# Patient Record
Sex: Female | Born: 1980 | Race: White | Hispanic: No | Marital: Single | Smoking: Current every day smoker
Health system: Southern US, Community
[De-identification: ages and names within clinical notes are randomized; demographics above are authoritative.]

## PROBLEM LIST (undated history)

## (undated) DIAGNOSIS — F1990 Other psychoactive substance use, unspecified, uncomplicated: Secondary | ICD-10-CM

## (undated) DIAGNOSIS — Z6835 Body mass index (BMI) 35.0-35.9, adult: Secondary | ICD-10-CM

## (undated) DIAGNOSIS — R5383 Other fatigue: Secondary | ICD-10-CM

## (undated) DIAGNOSIS — J45909 Unspecified asthma, uncomplicated: Secondary | ICD-10-CM

## (undated) DIAGNOSIS — T8859XA Other complications of anesthesia, initial encounter: Secondary | ICD-10-CM

## (undated) DIAGNOSIS — Z720 Tobacco use: Secondary | ICD-10-CM

## (undated) DIAGNOSIS — F1110 Opioid abuse, uncomplicated: Secondary | ICD-10-CM

## (undated) DIAGNOSIS — I456 Pre-excitation syndrome: Secondary | ICD-10-CM

## (undated) DIAGNOSIS — T4145XA Adverse effect of unspecified anesthetic, initial encounter: Secondary | ICD-10-CM

## (undated) DIAGNOSIS — I10 Essential (primary) hypertension: Secondary | ICD-10-CM

## (undated) HISTORY — DX: Other fatigue: R53.83

## (undated) HISTORY — DX: Body mass index (BMI) 35.0-35.9, adult: Z68.35

## (undated) HISTORY — DX: Pre-excitation syndrome: I45.6

## (undated) HISTORY — PX: SPINE SURGERY: SHX786

## (undated) HISTORY — PX: OTHER SURGICAL HISTORY: SHX169

## (undated) HISTORY — DX: Unspecified asthma, uncomplicated: J45.909

---

## 2006-04-04 ENCOUNTER — Emergency Department (HOSPITAL_COMMUNITY): Admission: EM | Admit: 2006-04-04 | Discharge: 2006-04-04 | Payer: Self-pay | Admitting: Emergency Medicine

## 2006-09-11 ENCOUNTER — Emergency Department (HOSPITAL_COMMUNITY): Admission: EM | Admit: 2006-09-11 | Discharge: 2006-09-11 | Payer: Self-pay | Admitting: Emergency Medicine

## 2009-05-23 ENCOUNTER — Emergency Department (HOSPITAL_COMMUNITY): Admission: EM | Admit: 2009-05-23 | Discharge: 2009-05-24 | Payer: Self-pay | Admitting: Emergency Medicine

## 2010-11-22 LAB — URINE MICROSCOPIC-ADD ON

## 2010-11-22 LAB — URINALYSIS, ROUTINE W REFLEX MICROSCOPIC
Hgb urine dipstick: NEGATIVE
Specific Gravity, Urine: 1.019
Urobilinogen, UA: 1

## 2011-11-29 DIAGNOSIS — M545 Low back pain, unspecified: Secondary | ICD-10-CM

## 2011-11-29 DIAGNOSIS — G8310 Monoplegia of lower limb affecting unspecified side: Secondary | ICD-10-CM

## 2011-11-29 DIAGNOSIS — R635 Abnormal weight gain: Secondary | ICD-10-CM

## 2011-11-29 HISTORY — DX: Low back pain, unspecified: M54.50

## 2011-11-29 HISTORY — DX: Abnormal weight gain: R63.5

## 2011-11-29 HISTORY — DX: Monoplegia of lower limb affecting unspecified side: G83.10

## 2012-04-09 DIAGNOSIS — D649 Anemia, unspecified: Secondary | ICD-10-CM

## 2012-04-09 DIAGNOSIS — F1920 Other psychoactive substance dependence, uncomplicated: Secondary | ICD-10-CM | POA: Insufficient documentation

## 2012-04-09 HISTORY — DX: Anemia, unspecified: D64.9

## 2012-04-09 HISTORY — DX: Other psychoactive substance dependence, uncomplicated: F19.20

## 2012-10-12 DIAGNOSIS — S82899A Other fracture of unspecified lower leg, initial encounter for closed fracture: Secondary | ICD-10-CM | POA: Insufficient documentation

## 2012-10-12 DIAGNOSIS — R739 Hyperglycemia, unspecified: Secondary | ICD-10-CM

## 2012-10-12 DIAGNOSIS — S82209A Unspecified fracture of shaft of unspecified tibia, initial encounter for closed fracture: Secondary | ICD-10-CM | POA: Insufficient documentation

## 2012-10-12 DIAGNOSIS — R7989 Other specified abnormal findings of blood chemistry: Secondary | ICD-10-CM

## 2012-10-12 DIAGNOSIS — S022XXA Fracture of nasal bones, initial encounter for closed fracture: Secondary | ICD-10-CM

## 2012-10-12 DIAGNOSIS — S82409A Unspecified fracture of shaft of unspecified fibula, initial encounter for closed fracture: Secondary | ICD-10-CM

## 2012-10-12 DIAGNOSIS — F1510 Other stimulant abuse, uncomplicated: Secondary | ICD-10-CM

## 2012-10-12 HISTORY — DX: Fracture of nasal bones, initial encounter for closed fracture: S02.2XXA

## 2012-10-12 HISTORY — DX: Unspecified fracture of shaft of unspecified tibia, initial encounter for closed fracture: S82.209A

## 2012-10-12 HISTORY — DX: Unspecified fracture of shaft of unspecified fibula, initial encounter for closed fracture: S82.409A

## 2012-10-12 HISTORY — DX: Other fracture of unspecified lower leg, initial encounter for closed fracture: S82.899A

## 2012-10-12 HISTORY — DX: Other stimulant abuse, uncomplicated: F15.10

## 2012-10-12 HISTORY — DX: Other specified abnormal findings of blood chemistry: R79.89

## 2012-10-12 HISTORY — DX: Hyperglycemia, unspecified: R73.9

## 2012-11-05 DIAGNOSIS — N179 Acute kidney failure, unspecified: Secondary | ICD-10-CM

## 2012-11-05 HISTORY — DX: Acute kidney failure, unspecified: N17.9

## 2016-07-02 ENCOUNTER — Emergency Department (HOSPITAL_COMMUNITY): Admission: EM | Admit: 2016-07-02 | Discharge: 2016-07-02 | Discharge status: Against Medical Advice | Payer: Self-pay

## 2016-07-02 NOTE — ED Triage Notes (Signed)
Pt came and stated she self catherines herself and she is out of them, tech gave pt in and out cath, pt them left without being triaged

## 2016-12-03 DIAGNOSIS — F1510 Other stimulant abuse, uncomplicated: Secondary | ICD-10-CM | POA: Diagnosis not present

## 2016-12-03 DIAGNOSIS — M62838 Other muscle spasm: Secondary | ICD-10-CM | POA: Diagnosis not present

## 2016-12-03 DIAGNOSIS — F1910 Other psychoactive substance abuse, uncomplicated: Secondary | ICD-10-CM | POA: Diagnosis not present

## 2016-12-03 DIAGNOSIS — G8220 Paraplegia, unspecified: Secondary | ICD-10-CM | POA: Diagnosis not present

## 2016-12-03 DIAGNOSIS — F1410 Cocaine abuse, uncomplicated: Secondary | ICD-10-CM | POA: Diagnosis not present

## 2016-12-03 DIAGNOSIS — Z720 Tobacco use: Secondary | ICD-10-CM | POA: Diagnosis not present

## 2016-12-03 DIAGNOSIS — A480 Gas gangrene: Secondary | ICD-10-CM

## 2016-12-03 DIAGNOSIS — R918 Other nonspecific abnormal finding of lung field: Secondary | ICD-10-CM | POA: Diagnosis not present

## 2016-12-03 DIAGNOSIS — A419 Sepsis, unspecified organism: Secondary | ICD-10-CM | POA: Diagnosis not present

## 2016-12-03 DIAGNOSIS — M86171 Other acute osteomyelitis, right ankle and foot: Secondary | ICD-10-CM | POA: Diagnosis not present

## 2016-12-03 DIAGNOSIS — N319 Neuromuscular dysfunction of bladder, unspecified: Secondary | ICD-10-CM | POA: Diagnosis not present

## 2016-12-03 DIAGNOSIS — I96 Gangrene, not elsewhere classified: Secondary | ICD-10-CM | POA: Diagnosis not present

## 2016-12-03 DIAGNOSIS — N179 Acute kidney failure, unspecified: Secondary | ICD-10-CM | POA: Diagnosis not present

## 2016-12-04 DIAGNOSIS — Z720 Tobacco use: Secondary | ICD-10-CM | POA: Diagnosis not present

## 2016-12-04 DIAGNOSIS — M62838 Other muscle spasm: Secondary | ICD-10-CM | POA: Diagnosis not present

## 2016-12-04 DIAGNOSIS — N319 Neuromuscular dysfunction of bladder, unspecified: Secondary | ICD-10-CM | POA: Diagnosis not present

## 2016-12-04 DIAGNOSIS — N179 Acute kidney failure, unspecified: Secondary | ICD-10-CM | POA: Diagnosis not present

## 2016-12-04 DIAGNOSIS — F1910 Other psychoactive substance abuse, uncomplicated: Secondary | ICD-10-CM | POA: Diagnosis not present

## 2016-12-04 DIAGNOSIS — F1410 Cocaine abuse, uncomplicated: Secondary | ICD-10-CM | POA: Diagnosis not present

## 2016-12-04 DIAGNOSIS — A419 Sepsis, unspecified organism: Secondary | ICD-10-CM | POA: Diagnosis not present

## 2016-12-04 DIAGNOSIS — F1510 Other stimulant abuse, uncomplicated: Secondary | ICD-10-CM | POA: Diagnosis not present

## 2016-12-04 DIAGNOSIS — A480 Gas gangrene: Secondary | ICD-10-CM | POA: Diagnosis not present

## 2016-12-04 DIAGNOSIS — M86171 Other acute osteomyelitis, right ankle and foot: Secondary | ICD-10-CM | POA: Diagnosis not present

## 2016-12-04 DIAGNOSIS — R918 Other nonspecific abnormal finding of lung field: Secondary | ICD-10-CM | POA: Diagnosis not present

## 2016-12-04 DIAGNOSIS — G8220 Paraplegia, unspecified: Secondary | ICD-10-CM | POA: Diagnosis not present

## 2016-12-05 DIAGNOSIS — A480 Gas gangrene: Secondary | ICD-10-CM | POA: Diagnosis not present

## 2016-12-05 DIAGNOSIS — N179 Acute kidney failure, unspecified: Secondary | ICD-10-CM | POA: Diagnosis not present

## 2016-12-05 DIAGNOSIS — M86171 Other acute osteomyelitis, right ankle and foot: Secondary | ICD-10-CM | POA: Diagnosis not present

## 2016-12-05 DIAGNOSIS — A419 Sepsis, unspecified organism: Secondary | ICD-10-CM | POA: Diagnosis not present

## 2016-12-06 DIAGNOSIS — I96 Gangrene, not elsewhere classified: Secondary | ICD-10-CM

## 2016-12-06 DIAGNOSIS — I517 Cardiomegaly: Secondary | ICD-10-CM

## 2016-12-06 DIAGNOSIS — N179 Acute kidney failure, unspecified: Secondary | ICD-10-CM | POA: Diagnosis not present

## 2016-12-06 DIAGNOSIS — A419 Sepsis, unspecified organism: Secondary | ICD-10-CM | POA: Diagnosis not present

## 2016-12-06 DIAGNOSIS — A480 Gas gangrene: Secondary | ICD-10-CM | POA: Diagnosis not present

## 2016-12-06 DIAGNOSIS — M86171 Other acute osteomyelitis, right ankle and foot: Secondary | ICD-10-CM

## 2016-12-07 DIAGNOSIS — M86171 Other acute osteomyelitis, right ankle and foot: Secondary | ICD-10-CM | POA: Diagnosis not present

## 2016-12-07 DIAGNOSIS — A480 Gas gangrene: Secondary | ICD-10-CM | POA: Diagnosis not present

## 2016-12-07 DIAGNOSIS — M62838 Other muscle spasm: Secondary | ICD-10-CM | POA: Diagnosis not present

## 2016-12-07 DIAGNOSIS — F1510 Other stimulant abuse, uncomplicated: Secondary | ICD-10-CM | POA: Diagnosis not present

## 2016-12-07 DIAGNOSIS — N179 Acute kidney failure, unspecified: Secondary | ICD-10-CM | POA: Diagnosis not present

## 2016-12-07 DIAGNOSIS — N319 Neuromuscular dysfunction of bladder, unspecified: Secondary | ICD-10-CM | POA: Diagnosis not present

## 2016-12-07 DIAGNOSIS — F1410 Cocaine abuse, uncomplicated: Secondary | ICD-10-CM | POA: Diagnosis not present

## 2016-12-07 DIAGNOSIS — Z720 Tobacco use: Secondary | ICD-10-CM | POA: Diagnosis not present

## 2016-12-07 DIAGNOSIS — I472 Ventricular tachycardia: Secondary | ICD-10-CM | POA: Diagnosis not present

## 2016-12-07 DIAGNOSIS — R918 Other nonspecific abnormal finding of lung field: Secondary | ICD-10-CM | POA: Diagnosis not present

## 2016-12-07 DIAGNOSIS — G8220 Paraplegia, unspecified: Secondary | ICD-10-CM | POA: Diagnosis not present

## 2016-12-07 DIAGNOSIS — F1910 Other psychoactive substance abuse, uncomplicated: Secondary | ICD-10-CM | POA: Diagnosis not present

## 2016-12-07 DIAGNOSIS — A419 Sepsis, unspecified organism: Secondary | ICD-10-CM | POA: Diagnosis not present

## 2016-12-08 DIAGNOSIS — M86171 Other acute osteomyelitis, right ankle and foot: Secondary | ICD-10-CM | POA: Diagnosis not present

## 2016-12-08 DIAGNOSIS — A419 Sepsis, unspecified organism: Secondary | ICD-10-CM | POA: Diagnosis not present

## 2016-12-08 DIAGNOSIS — N179 Acute kidney failure, unspecified: Secondary | ICD-10-CM | POA: Diagnosis not present

## 2016-12-08 DIAGNOSIS — I472 Ventricular tachycardia: Secondary | ICD-10-CM | POA: Diagnosis not present

## 2016-12-08 DIAGNOSIS — A480 Gas gangrene: Secondary | ICD-10-CM | POA: Diagnosis not present

## 2016-12-09 DIAGNOSIS — A480 Gas gangrene: Secondary | ICD-10-CM | POA: Diagnosis not present

## 2016-12-09 DIAGNOSIS — A419 Sepsis, unspecified organism: Secondary | ICD-10-CM | POA: Diagnosis not present

## 2016-12-09 DIAGNOSIS — M86171 Other acute osteomyelitis, right ankle and foot: Secondary | ICD-10-CM | POA: Diagnosis not present

## 2016-12-09 DIAGNOSIS — N179 Acute kidney failure, unspecified: Secondary | ICD-10-CM | POA: Diagnosis not present

## 2016-12-10 DIAGNOSIS — M86171 Other acute osteomyelitis, right ankle and foot: Secondary | ICD-10-CM | POA: Diagnosis not present

## 2016-12-10 DIAGNOSIS — A419 Sepsis, unspecified organism: Secondary | ICD-10-CM | POA: Diagnosis not present

## 2016-12-10 DIAGNOSIS — A480 Gas gangrene: Secondary | ICD-10-CM | POA: Diagnosis not present

## 2016-12-10 DIAGNOSIS — N179 Acute kidney failure, unspecified: Secondary | ICD-10-CM | POA: Diagnosis not present

## 2016-12-11 DIAGNOSIS — M86171 Other acute osteomyelitis, right ankle and foot: Secondary | ICD-10-CM | POA: Diagnosis not present

## 2016-12-11 DIAGNOSIS — N179 Acute kidney failure, unspecified: Secondary | ICD-10-CM | POA: Diagnosis not present

## 2016-12-11 DIAGNOSIS — A419 Sepsis, unspecified organism: Secondary | ICD-10-CM | POA: Diagnosis not present

## 2016-12-11 DIAGNOSIS — A480 Gas gangrene: Secondary | ICD-10-CM | POA: Diagnosis not present

## 2016-12-12 DIAGNOSIS — M86171 Other acute osteomyelitis, right ankle and foot: Secondary | ICD-10-CM | POA: Diagnosis not present

## 2016-12-12 DIAGNOSIS — A480 Gas gangrene: Secondary | ICD-10-CM | POA: Diagnosis not present

## 2016-12-12 DIAGNOSIS — A419 Sepsis, unspecified organism: Secondary | ICD-10-CM | POA: Diagnosis not present

## 2016-12-12 DIAGNOSIS — N179 Acute kidney failure, unspecified: Secondary | ICD-10-CM | POA: Diagnosis not present

## 2016-12-13 ENCOUNTER — Ambulatory Visit: Payer: Medicaid Other | Admitting: Podiatry

## 2017-02-07 HISTORY — PX: LEG AMPUTATION: SHX1105

## 2017-02-17 DIAGNOSIS — L03115 Cellulitis of right lower limb: Secondary | ICD-10-CM

## 2017-02-17 DIAGNOSIS — N390 Urinary tract infection, site not specified: Secondary | ICD-10-CM | POA: Diagnosis not present

## 2017-02-17 DIAGNOSIS — G9341 Metabolic encephalopathy: Secondary | ICD-10-CM

## 2017-02-17 DIAGNOSIS — R42 Dizziness and giddiness: Secondary | ICD-10-CM

## 2017-02-17 DIAGNOSIS — R509 Fever, unspecified: Secondary | ICD-10-CM | POA: Diagnosis not present

## 2017-02-17 DIAGNOSIS — M86171 Other acute osteomyelitis, right ankle and foot: Secondary | ICD-10-CM

## 2017-02-18 DIAGNOSIS — L03115 Cellulitis of right lower limb: Secondary | ICD-10-CM | POA: Diagnosis not present

## 2017-02-18 DIAGNOSIS — G9341 Metabolic encephalopathy: Secondary | ICD-10-CM | POA: Diagnosis not present

## 2017-02-18 DIAGNOSIS — R42 Dizziness and giddiness: Secondary | ICD-10-CM | POA: Diagnosis not present

## 2017-02-18 DIAGNOSIS — M86171 Other acute osteomyelitis, right ankle and foot: Secondary | ICD-10-CM | POA: Diagnosis not present

## 2017-02-18 DIAGNOSIS — R509 Fever, unspecified: Secondary | ICD-10-CM | POA: Diagnosis not present

## 2017-06-01 ENCOUNTER — Other Ambulatory Visit: Payer: Self-pay

## 2017-06-01 ENCOUNTER — Encounter (HOSPITAL_COMMUNITY): Payer: Self-pay | Admitting: Emergency Medicine

## 2017-06-01 ENCOUNTER — Emergency Department (HOSPITAL_COMMUNITY): Payer: Medicaid Other

## 2017-06-01 ENCOUNTER — Inpatient Hospital Stay (HOSPITAL_COMMUNITY)
Admission: EM | Admit: 2017-06-01 | Discharge: 2017-06-02 | DRG: 541 | Disposition: A | Discharge status: Home / Self Care | Payer: Medicaid Other | Attending: Internal Medicine | Admitting: Internal Medicine

## 2017-06-01 DIAGNOSIS — M86471 Chronic osteomyelitis with draining sinus, right ankle and foot: Secondary | ICD-10-CM | POA: Diagnosis present

## 2017-06-01 DIAGNOSIS — R002 Palpitations: Secondary | ICD-10-CM | POA: Diagnosis present

## 2017-06-01 DIAGNOSIS — M869 Osteomyelitis, unspecified: Secondary | ICD-10-CM

## 2017-06-01 DIAGNOSIS — Z833 Family history of diabetes mellitus: Secondary | ICD-10-CM

## 2017-06-01 DIAGNOSIS — G8389 Other specified paralytic syndromes: Secondary | ICD-10-CM | POA: Diagnosis present

## 2017-06-01 DIAGNOSIS — Z8249 Family history of ischemic heart disease and other diseases of the circulatory system: Secondary | ICD-10-CM

## 2017-06-01 DIAGNOSIS — Z720 Tobacco use: Secondary | ICD-10-CM

## 2017-06-01 DIAGNOSIS — W3400XS Accidental discharge from unspecified firearms or gun, sequela: Secondary | ICD-10-CM | POA: Diagnosis not present

## 2017-06-01 DIAGNOSIS — F17210 Nicotine dependence, cigarettes, uncomplicated: Secondary | ICD-10-CM | POA: Diagnosis present

## 2017-06-01 DIAGNOSIS — F1911 Other psychoactive substance abuse, in remission: Secondary | ICD-10-CM | POA: Diagnosis present

## 2017-06-01 DIAGNOSIS — F1990 Other psychoactive substance use, unspecified, uncomplicated: Secondary | ICD-10-CM | POA: Diagnosis not present

## 2017-06-01 DIAGNOSIS — B955 Unspecified streptococcus as the cause of diseases classified elsewhere: Secondary | ICD-10-CM | POA: Diagnosis present

## 2017-06-01 DIAGNOSIS — I10 Essential (primary) hypertension: Secondary | ICD-10-CM

## 2017-06-01 DIAGNOSIS — R000 Tachycardia, unspecified: Secondary | ICD-10-CM | POA: Diagnosis present

## 2017-06-01 DIAGNOSIS — R079 Chest pain, unspecified: Secondary | ICD-10-CM | POA: Diagnosis present

## 2017-06-01 DIAGNOSIS — R590 Localized enlarged lymph nodes: Secondary | ICD-10-CM | POA: Diagnosis present

## 2017-06-01 DIAGNOSIS — S31149S Puncture wound of abdominal wall with foreign body, unspecified quadrant without penetration into peritoneal cavity, sequela: Secondary | ICD-10-CM

## 2017-06-01 DIAGNOSIS — M79671 Pain in right foot: Secondary | ICD-10-CM | POA: Diagnosis not present

## 2017-06-01 DIAGNOSIS — Z885 Allergy status to narcotic agent status: Secondary | ICD-10-CM | POA: Diagnosis not present

## 2017-06-01 DIAGNOSIS — F1110 Opioid abuse, uncomplicated: Secondary | ICD-10-CM

## 2017-06-01 DIAGNOSIS — M86671 Other chronic osteomyelitis, right ankle and foot: Secondary | ICD-10-CM | POA: Diagnosis not present

## 2017-06-01 DIAGNOSIS — B9561 Methicillin susceptible Staphylococcus aureus infection as the cause of diseases classified elsewhere: Secondary | ICD-10-CM | POA: Diagnosis present

## 2017-06-01 HISTORY — DX: Adverse effect of unspecified anesthetic, initial encounter: T41.45XA

## 2017-06-01 HISTORY — DX: Other complications of anesthesia, initial encounter: T88.59XA

## 2017-06-01 HISTORY — DX: Other psychoactive substance use, unspecified, uncomplicated: F19.90

## 2017-06-01 HISTORY — DX: Tobacco use: Z72.0

## 2017-06-01 HISTORY — DX: Opioid abuse, uncomplicated: F11.10

## 2017-06-01 HISTORY — DX: Osteomyelitis, unspecified: M86.9

## 2017-06-01 HISTORY — DX: Essential (primary) hypertension: I10

## 2017-06-01 LAB — CBC WITH DIFFERENTIAL/PLATELET
BASOS PCT: 0 %
Basophils Absolute: 0 10*3/uL (ref 0.0–0.1)
EOS ABS: 0.2 10*3/uL (ref 0.0–0.7)
Eosinophils Relative: 2 %
HEMATOCRIT: 45.5 % (ref 36.0–46.0)
Hemoglobin: 15.4 g/dL — ABNORMAL HIGH (ref 12.0–15.0)
Lymphocytes Relative: 22 %
Lymphs Abs: 2.1 10*3/uL (ref 0.7–4.0)
MCH: 27.9 pg (ref 26.0–34.0)
MCHC: 33.8 g/dL (ref 30.0–36.0)
MCV: 82.6 fL (ref 78.0–100.0)
MONOS PCT: 7 %
Monocytes Absolute: 0.7 10*3/uL (ref 0.1–1.0)
Neutro Abs: 6.5 10*3/uL (ref 1.7–7.7)
Neutrophils Relative %: 69 %
Platelets: 362 10*3/uL (ref 150–400)
RBC: 5.51 MIL/uL — ABNORMAL HIGH (ref 3.87–5.11)
RDW: 14.9 % (ref 11.5–15.5)
WBC: 9.4 10*3/uL (ref 4.0–10.5)

## 2017-06-01 LAB — COMPREHENSIVE METABOLIC PANEL
ALBUMIN: 3.9 g/dL (ref 3.5–5.0)
ALK PHOS: 162 U/L — AB (ref 38–126)
ALT: 27 U/L (ref 14–54)
AST: 25 U/L (ref 15–41)
Anion gap: 10 (ref 5–15)
BUN: 30 mg/dL — ABNORMAL HIGH (ref 6–20)
CALCIUM: 9.8 mg/dL (ref 8.9–10.3)
CO2: 27 mmol/L (ref 22–32)
CREATININE: 0.84 mg/dL (ref 0.44–1.00)
Chloride: 102 mmol/L (ref 101–111)
GFR calc Af Amer: >60 mL/min (ref >60)
GFR calc non Af Amer: >60 mL/min (ref >60)
GLUCOSE: 104 mg/dL — AB (ref 65–99)
Potassium: 5 mmol/L (ref 3.5–5.1)
SODIUM: 139 mmol/L (ref 135–145)
Total Bilirubin: 0.3 mg/dL (ref 0.3–1.2)
Total Protein: 9.4 g/dL — ABNORMAL HIGH (ref 6.5–8.1)

## 2017-06-01 LAB — RAPID URINE DRUG SCREEN, HOSP PERFORMED
AMPHETAMINES: NOT DETECTED
BARBITURATES: NOT DETECTED
Benzodiazepines: NOT DETECTED
Cocaine: NOT DETECTED
Opiates: NOT DETECTED
Tetrahydrocannabinol: NOT DETECTED

## 2017-06-01 LAB — URINALYSIS, ROUTINE W REFLEX MICROSCOPIC
BILIRUBIN URINE: NEGATIVE
Glucose, UA: NEGATIVE mg/dL
Hgb urine dipstick: NEGATIVE
KETONES UR: NEGATIVE mg/dL
Leukocytes, UA: NEGATIVE
NITRITE: NEGATIVE
PH: 6 (ref 5.0–8.0)
Protein, ur: NEGATIVE mg/dL
Specific Gravity, Urine: 1.019 (ref 1.005–1.030)

## 2017-06-01 LAB — PROTIME-INR
INR: 0.98
PROTHROMBIN TIME: 12.9 s (ref 11.4–15.2)

## 2017-06-01 LAB — SEDIMENTATION RATE: Sed Rate: 33 mm/hr — ABNORMAL HIGH (ref 0–22)

## 2017-06-01 LAB — TYPE AND SCREEN
ABO/RH(D): A POS
ANTIBODY SCREEN: NEGATIVE

## 2017-06-01 LAB — HCG, QUANTITATIVE, PREGNANCY: hCG, Beta Chain, Quant, S: <1 m[IU]/mL (ref <5)

## 2017-06-01 LAB — APTT: aPTT: 35 seconds (ref 24–36)

## 2017-06-01 LAB — C-REACTIVE PROTEIN

## 2017-06-01 LAB — I-STAT CG4 LACTIC ACID, ED: Lactic Acid, Venous: 1.04 mmol/L (ref 0.5–1.9)

## 2017-06-01 MED ORDER — ZOLPIDEM TARTRATE 5 MG PO TABS: 5.0000 mg | ORAL_TABLET | Freq: Every evening | ORAL | Status: DC | PRN

## 2017-06-01 MED ORDER — ACETAMINOPHEN 650 MG RE SUPP: 650.0000 mg | Freq: Four times a day (QID) | RECTAL | Status: DC | PRN

## 2017-06-01 MED ORDER — MUPIROCIN 2 % EX OINT
1.0000 "application " | TOPICAL_OINTMENT | Freq: Two times a day (BID) | CUTANEOUS | Status: DC
  Administered 2017-06-02: 1 via NASAL
  Filled 2017-06-01: qty 22

## 2017-06-01 MED ORDER — SODIUM CHLORIDE 0.9 % IV SOLN
INTRAVENOUS | Status: DC
  Administered 2017-06-01 – 2017-06-02 (×2): via INTRAVENOUS

## 2017-06-01 MED ORDER — NICOTINE 21 MG/24HR TD PT24
21.0000 mg | MEDICATED_PATCH | TRANSDERMAL | Status: DC
  Administered 2017-06-01: 21 mg via TRANSDERMAL
  Filled 2017-06-01: qty 1

## 2017-06-01 MED ORDER — KETOROLAC TROMETHAMINE 30 MG/ML IJ SOLN
15.0000 mg | Freq: Four times a day (QID) | INTRAMUSCULAR | Status: DC | PRN
  Administered 2017-06-02 (×2): 15 mg via INTRAVENOUS
  Filled 2017-06-01 (×2): qty 1

## 2017-06-01 MED ORDER — KETOROLAC TROMETHAMINE 30 MG/ML IJ SOLN
15.0000 mg | Freq: Once | INTRAMUSCULAR | Status: AC
  Administered 2017-06-01: 15 mg via INTRAVENOUS
  Filled 2017-06-01: qty 1

## 2017-06-01 MED ORDER — ONDANSETRON HCL 4 MG PO TABS: 4.0000 mg | ORAL_TABLET | Freq: Four times a day (QID) | ORAL | Status: DC | PRN

## 2017-06-01 MED ORDER — SENNOSIDES-DOCUSATE SODIUM 8.6-50 MG PO TABS: 1.0000 | ORAL_TABLET | Freq: Every evening | ORAL | Status: DC | PRN

## 2017-06-01 MED ORDER — HYDRALAZINE HCL 20 MG/ML IJ SOLN: 5.0000 mg | INTRAMUSCULAR | Status: DC | PRN

## 2017-06-01 MED ORDER — ACETAMINOPHEN 325 MG PO TABS
650.0000 mg | ORAL_TABLET | Freq: Four times a day (QID) | ORAL | Status: DC | PRN
  Administered 2017-06-01 – 2017-06-02 (×3): 650 mg via ORAL
  Filled 2017-06-01 (×4): qty 2

## 2017-06-01 MED ORDER — ONDANSETRON HCL 4 MG/2ML IJ SOLN: 4.0000 mg | Freq: Four times a day (QID) | INTRAMUSCULAR | Status: DC | PRN

## 2017-06-01 MED ORDER — SODIUM CHLORIDE 0.9 % IV BOLUS
1000.0000 mL | Freq: Once | INTRAVENOUS | Status: AC
  Administered 2017-06-01: 1000 mL via INTRAVENOUS

## 2017-06-01 NOTE — ED Notes (Signed)
Vein dried up before I could get a dark green for the I Stat lactic

## 2017-06-01 NOTE — ED Notes (Signed)
Pt made aware that she may eat until midnight.

## 2017-06-01 NOTE — ED Notes (Signed)
Pt self caths  

## 2017-06-01 NOTE — ED Notes (Signed)
Unable to get blood from hall c. Tech and/or phlebotomy to try.
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
## 2017-06-01 NOTE — ED Triage Notes (Signed)
Per patient, states she has took holes in her right ankle which is draining puss-states she was suppose to get foot amputated but went to jail instead-states it has been going on for months-complaining of chest pain at night and heart racing

## 2017-06-01 NOTE — ED Notes (Signed)
Called main lab to get the dark green tube

## 2017-06-01 NOTE — ED Notes (Signed)
[facility] Expected date:  Expected time:  Means of arrival:  Comments: Hold for triage-[place]

## 2017-06-01 NOTE — ED Notes (Signed)
ED TO INPATIENT HANDOFF REPORT  Name/Age/Gender Jennifer Thomas 37 y.o. female  Code Status    Code Status Orders  (From admission, onward)        Start     Ordered   06/01/17 2032  Full code  Continuous     06/01/17 2032    Code Status History    This patient has a current code status but no historical code status.      Home/SNF/Other jail  Chief Complaint ankle pain (R)  Level of Care/Admitting Diagnosis ED Disposition    ED Disposition Condition [place] [facility] Area: [place] [100102]  Level of Care: Med-Surg [16]  Diagnosis: Osteomyelitis of right foot [facility]) [[id number]]  Admitting Physician: Ivor Costa [[id number]]  Attending Physician: Ivor Costa [phone]  Estimated length of stay: past midnight tomorrow  Certification:: I certify this patient will need inpatient services for at least 2 midnights  PT Class (Do Not Modify): Inpatient [101]  PT Acc Code (Do Not Modify): Private [1]       Medical History Past Medical History:  Diagnosis Date  . Complication of anesthesia    tolerates aneth. ok but very afraid of face masks  . Heroin abuse ([place])   . Hypertension   . IVDU (intravenous drug user)   . Tobacco abuse     Allergies Allergies  Allergen Reactions  . Morphine And Related Anaphylaxis    IV Location/Drains/Wounds Patient Lines/Drains/Airways Status   Active Line/Drains/Airways    Name:   Placement date:   Placement time:   Site:   Days:   Peripheral IV 06/01/17 Right Hand   06/01/17    1604    Hand   less than 1          Labs/Imaging Results for orders placed or performed during the hospital encounter of 06/01/17 (from the past 48 hour(s))  Comprehensive metabolic panel     Status: Abnormal   Collection Time: 06/01/17  3:47 PM  Result Value Ref Range   Sodium 139 135 - 145 mmol/L   Potassium 5.0 3.5 - 5.1 mmol/L   Chloride 102 101 - 111 mmol/L   CO2 27 22 - 32 mmol/L   Glucose, Bld 104 (H) 65 - 99 mg/dL   BUN 30 (H) 6 - 20 mg/dL   Creatinine, Ser 0.84 0.44 - 1.00 mg/dL   Calcium 9.8 8.9 - 10.3 mg/dL   Total Protein 9.4 (H) 6.5 - 8.1 g/dL   Albumin 3.9 3.5 - 5.0 g/dL   AST 25 15 - 41 U/L   ALT 27 14 - 54 U/L   Alkaline Phosphatase 162 (H) 38 - 126 U/L   Total Bilirubin 0.3 0.3 - 1.2 mg/dL   GFR calc non Af Amer >60 >60 mL/min   GFR calc Af Amer >60 >60 mL/min    Comment: (NOTE) The eGFR has been calculated using the CKD EPI equation. This calculation has not been validated in all clinical situations. eGFR's persistently <60 mL/min signify possible Chronic Kidney Disease.    Anion gap 10 5 - 15    Comment: Performed at [facility], [place] [address], [place],  [address]  CBC with Differential     Status: Abnormal   Collection Time: 06/01/17  3:47 PM  Result Value Ref Range   WBC 9.4 4.0 - 10.5 K/uL   RBC 5.51 (H) 3.87 - 5.11 MIL/uL   Hemoglobin 15.4 (H) 12.0 - 15.0 g/dL   HCT  45.5 36.0 - 46.0 %   MCV 82.6 78.0 - 100.0 fL   MCH 27.9 26.0 - 34.0 pg   MCHC 33.8 30.0 - 36.0 g/dL   RDW 14.9 11.5 - 15.5 %   Platelets 362 150 - 400 K/uL   Neutrophils Relative % 69 %   Neutro Abs 6.5 1.7 - 7.7 K/uL   Lymphocytes Relative 22 %   Lymphs Abs 2.1 0.7 - 4.0 K/uL   Monocytes Relative 7 %   Monocytes Absolute 0.7 0.1 - 1.0 K/uL   Eosinophils Relative 2 %   Eosinophils Absolute 0.2 0.0 - 0.7 K/uL   Basophils Relative 0 %   Basophils Absolute 0.0 0.0 - 0.1 K/uL    Comment: Performed at [facility], [place] [address], [place], [place] [address]  Urinalysis, Routine w reflex microscopic     Status: None   Collection Time: 06/01/17  3:47 PM  Result Value Ref Range   Color, Urine YELLOW YELLOW   APPearance CLEAR CLEAR   Specific Gravity, Urine 1.019 1.005 - 1.030   pH 6.0 5.0 - 8.0   Glucose, UA NEGATIVE NEGATIVE mg/dL   Hgb urine dipstick NEGATIVE NEGATIVE   Bilirubin Urine NEGATIVE NEGATIVE   Ketones, ur NEGATIVE NEGATIVE mg/dL   Protein, ur  NEGATIVE NEGATIVE mg/dL   Nitrite NEGATIVE NEGATIVE   Leukocytes, UA NEGATIVE NEGATIVE    Comment: Performed at [place] [address], [place], [place] [address]  Sedimentation rate     Status: Abnormal   Collection Time: 06/01/17  3:47 PM  Result Value Ref Range   Sed Rate 33 (H) 0 - 22 mm/hr    Comment: Performed at [facility], [place] [address] [place], [place] [address]  C-reactive protein     Status: None   Collection Time: 06/01/17  3:47 PM  Result Value Ref Range   CRP <0.8 <1.0 mg/dL    Comment: Performed at [facility], [place] [address] [place], [place] [address]  I-Stat CG4 Lactic Acid, ED     Status: None   Collection Time: 06/01/17  5:00 PM  Result Value Ref Range   Lactic Acid, Venous 1.04 0.5 - 1.9 mmol/L  Protime-INR     Status: None   Collection Time: 06/01/17  8:56 PM  Result Value Ref Range   Prothrombin Time 12.9 11.4 - 15.2 seconds   INR 0.98     Comment: Performed at [facility], [place] [address], [place], [place] [address]  APTT     Status: None   Collection Time: 06/01/17  8:56 PM  Result Value Ref Range   aPTT 35 24 - 36 seconds    Comment: Performed at [facility], [place] [address], [place], [place] [address]   Dg Foot Complete Right  Result Date: 06/01/2017 CLINICAL DATA:  Per order- possible infection Per patient, states she has took holes in her right ankle which is draining puss-states she was supposed to get foot amputated but went to jail instead-states it has been going on for months EXAM: RIGHT FOOT COMPLETE - 3+ VIEW COMPARISON:  CT of the LOWER extremity on 02/17/2016 FINDINGS: There is deformity of the distal tibia and fibula, consistent with prior fracture. There is diffuse soft tissue swelling of the foot and ankle. Sclerotic and lytic lesion in the calcaneus is consistent with osteomyelitis and has progressed since the previous exam. There is a soft tissue  defect and a small  area of soft tissue gas along the plantar surface of the heel. IMPRESSION: Progressive changes of osteomyelitis. Suspect draining fistula to the plantar surface of the heel, consistent with Brodie abscess. Electronically Signed   By: Nolon Nations M.D.   On: 06/01/2017 18:19    Pending Labs Unresulted Labs (From admission, onward)   Start     Ordered   06/01/17 2030  hCG, quantitative, pregnancy  Once,   R     06/01/17 2030   06/01/17 2029  Type and screen [place]  Once,   R    Comments:  [place]    06/01/17 2029   06/01/17 2029  HIV antibody  Once,   R     06/01/17 2029   06/01/17 2028  Aerobic/Anaerobic Culture (surgical/deep wound)  Once,   R     06/01/17 2027   06/01/17 2028  Rapid urine drug screen (hospital performed)  Once,   R     06/01/17 2029   06/01/17 2027  Culture, blood (Routine X 2) w Reflex to ID Panel  BLOOD CULTURE X 2,   R    Comments:  Please obtain prior to antibiotic administration.    06/01/17 2027      Vitals/Pain Today's Vitals   06/01/17 1505 06/01/17 1512 06/01/17 1736 06/01/17 2004  BP:  (!) 132/105 119/84 124/84  Pulse:  (!) 108 75 76  Resp:  '16 18 18  '$ Temp:  98 F (36.7 C) 98.4 F (36.9 C)   TempSrc:  Oral Oral   SpO2:  100% 98% 99%  PainSc: 10-Worst pain ever       Isolation Precautions No active isolations  Medications Medications  ketorolac (TORADOL) 30 MG/ML injection 15 mg (has no administration in time range)  nicotine (NICODERM CQ - dosed in mg/24 hours) patch 21 mg (has no administration in time range)  acetaminophen (TYLENOL) tablet 650 mg (has no administration in time range)    Or  acetaminophen (TYLENOL) suppository 650 mg (has no administration in time range)  senna-docusate (Senokot-S) tablet 1 tablet (has no administration in time range)  ondansetron (ZOFRAN) tablet 4 mg (has no administration in time range)    Or  ondansetron (ZOFRAN) injection 4 mg  (has no administration in time range)  hydrALAZINE (APRESOLINE) injection 5 mg (has no administration in time range)  zolpidem (AMBIEN) tablet 5 mg (has no administration in time range)  0.9 %  sodium chloride infusion (has no administration in time range)  sodium chloride 0.9 % bolus 1,000 mL (has no administration in time range)  ketorolac (TORADOL) 30 MG/ML injection 15 mg (15 mg Intravenous Given 06/01/17 1710)    Mobility non-ambulatory

## 2017-06-01 NOTE — Plan of Care (Signed)
Reviewed plan of care, safety precautions, and pain control measures. Pt attentive and verbalized understanding.

## 2017-06-02 ENCOUNTER — Encounter (HOSPITAL_COMMUNITY): Payer: Self-pay | Admitting: Internal Medicine

## 2017-06-02 ENCOUNTER — Inpatient Hospital Stay (HOSPITAL_COMMUNITY): Payer: Medicaid Other

## 2017-06-02 DIAGNOSIS — M86471 Chronic osteomyelitis with draining sinus, right ankle and foot: Principal | ICD-10-CM

## 2017-06-02 DIAGNOSIS — M86671 Other chronic osteomyelitis, right ankle and foot: Secondary | ICD-10-CM

## 2017-06-02 DIAGNOSIS — I10 Essential (primary) hypertension: Secondary | ICD-10-CM

## 2017-06-02 LAB — LIPID PANEL
Cholesterol: 106 mg/dL (ref 0–200)
HDL: 26 mg/dL — ABNORMAL LOW (ref >40)
LDL CALC: 61 mg/dL (ref 0–99)
Total CHOL/HDL Ratio: 4.1 RATIO
Triglycerides: 97 mg/dL (ref <150)
VLDL: 19 mg/dL (ref 0–40)

## 2017-06-02 LAB — HIV ANTIBODY (ROUTINE TESTING W REFLEX): HIV Screen 4th Generation wRfx: NONREACTIVE

## 2017-06-02 LAB — SURGICAL PCR SCREEN
MRSA, PCR: POSITIVE — AB
Staphylococcus aureus: POSITIVE — AB

## 2017-06-02 LAB — ABO/RH: ABO/RH(D): A POS

## 2017-06-02 LAB — ECHOCARDIOGRAM COMPLETE
Height: 63 in
WEIGHTICAEL: 2480 [oz_av]

## 2017-06-02 LAB — TROPONIN I

## 2017-06-02 LAB — GLUCOSE, CAPILLARY: GLUCOSE-CAPILLARY: 86 mg/dL (ref 65–99)

## 2017-06-02 LAB — HEMOGLOBIN A1C
HEMOGLOBIN A1C: 5.3 % (ref 4.8–5.6)
Mean Plasma Glucose: 105.41 mg/dL

## 2017-06-02 LAB — TSH: TSH: 0.633 u[IU]/mL (ref 0.350–4.500)

## 2017-06-02 MED ORDER — IBUPROFEN 400 MG PO TABS
400.0000 mg | ORAL_TABLET | Freq: Four times a day (QID) | ORAL | Status: DC | PRN
  Administered 2017-06-02 (×2): 400 mg via ORAL
  Filled 2017-06-02 (×2): qty 1

## 2017-06-02 MED ORDER — METHOCARBAMOL 500 MG PO TABS
500.0000 mg | ORAL_TABLET | Freq: Three times a day (TID) | ORAL | Status: DC | PRN
  Administered 2017-06-02: 500 mg via ORAL
  Filled 2017-06-02: qty 1

## 2017-06-02 MED ORDER — SACCHAROMYCES BOULARDII 250 MG PO CAPS: 250.0000 mg | ORAL_CAPSULE | Freq: Two times a day (BID) | ORAL | 0 refills | Status: DC

## 2017-06-02 MED ORDER — JUVEN PO PACK: 1.0000 | PACK | Freq: Two times a day (BID) | ORAL | 0 refills | Status: DC

## 2017-06-02 MED ORDER — GABAPENTIN 300 MG PO CAPS: 300.0000 mg | ORAL_CAPSULE | Freq: Three times a day (TID) | ORAL | 0 refills | Status: DC

## 2017-06-02 MED ORDER — FAMOTIDINE 20 MG PO TABS: 20.0000 mg | ORAL_TABLET | Freq: Every day | ORAL | 0 refills | Status: DC

## 2017-06-02 MED ORDER — CHLORHEXIDINE GLUCONATE CLOTH 2 % EX PADS
6.0000 | MEDICATED_PAD | Freq: Every day | CUTANEOUS | Status: DC
  Administered 2017-06-02: 6 via TOPICAL

## 2017-06-02 MED ORDER — NAPROXEN 500 MG PO TBEC: 500.0000 mg | DELAYED_RELEASE_TABLET | Freq: Two times a day (BID) | ORAL | 0 refills | Status: AC

## 2017-06-02 MED ORDER — GABAPENTIN 300 MG PO CAPS
300.0000 mg | ORAL_CAPSULE | Freq: Three times a day (TID) | ORAL | Status: DC
  Administered 2017-06-02 (×2): 300 mg via ORAL
  Filled 2017-06-02 (×2): qty 1

## 2017-06-02 MED ORDER — SENNOSIDES-DOCUSATE SODIUM 8.6-50 MG PO TABS: 1.0000 | ORAL_TABLET | Freq: Every evening | ORAL | 0 refills | Status: DC | PRN

## 2017-06-02 MED ORDER — JUVEN PO PACK
1.0000 | PACK | Freq: Two times a day (BID) | ORAL | Status: DC
  Administered 2017-06-02: 1 via ORAL
  Filled 2017-06-02: qty 1

## 2017-06-02 MED ORDER — NICOTINE 21 MG/24HR TD PT24: 21.0000 mg | MEDICATED_PATCH | TRANSDERMAL | 0 refills | Status: DC

## 2017-06-02 MED ORDER — IBUPROFEN 400 MG PO TABS: 400.0000 mg | ORAL_TABLET | Freq: Four times a day (QID) | ORAL | Status: DC | PRN

## 2017-06-02 MED ORDER — DOXYCYCLINE HYCLATE 100 MG PO TABS
100.0000 mg | ORAL_TABLET | Freq: Two times a day (BID) | ORAL | Status: DC
  Administered 2017-06-02: 100 mg via ORAL
  Filled 2017-06-02: qty 1

## 2017-06-02 MED ORDER — METHOCARBAMOL 500 MG PO TABS: 500.0000 mg | ORAL_TABLET | Freq: Three times a day (TID) | ORAL | 0 refills | Status: DC | PRN

## 2017-06-02 MED ORDER — SACCHAROMYCES BOULARDII 250 MG PO CAPS
250.0000 mg | ORAL_CAPSULE | Freq: Two times a day (BID) | ORAL | Status: DC
  Administered 2017-06-02: 250 mg via ORAL
  Filled 2017-06-02: qty 1

## 2017-06-02 MED ORDER — GABAPENTIN 600 MG PO TABS
300.0000 mg | ORAL_TABLET | Freq: Three times a day (TID) | ORAL | Status: DC
  Filled 2017-06-02 (×2): qty 0.5

## 2017-06-02 MED ORDER — DOXYCYCLINE HYCLATE 100 MG PO TABS: 100.0000 mg | ORAL_TABLET | Freq: Two times a day (BID) | ORAL | 0 refills | Status: AC
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
## 2017-06-02 NOTE — Consult Note (Signed)
WOC Nurse wound consult note Reason for Consult: Nonhealing chronic draining wound to right plantar heel and right lateral foot.  Wounds both ooze serosanguinous purulence at time.  She indicates that amputation had been recommended at one point. She is currently incarcerated.   Wound type: chronc nonhealing  Infectious  Pressure Injury POA: NA Measurement:right plantar heel:  0.5 cm opening with circumferential callous noted to periwound.   Right lateral foot:  0.3 cm opening with periwound erythema and edema.  Right foot is swollen.  Wound BJY:NWGNFAbed:unable to visualize Drainage (amount, consistency, odor) minimal purulence  No odor Periwound:edema and erythema Dressing procedure/placement/frequency:Cleanse wounds to right plantar foot and right lateral foot.  Cover with small piece Aquacel Ag.  Cover and secure with kerlix and tape.  Change Monday/Wednesday/Friday Will not follow at this time.  Please re-consult if needed.  Maple HudsonKaren Maybel Dambrosio RN BSN CWON Pager [phone]

## 2017-06-02 NOTE — Progress Notes (Signed)
Patient discharged back to prison with corrections officers. PIV d/c'd. Dressing changed prior to d/c. Patient verbalized understanding of all instructions. Written instructions provided for nurses at accepting facility. Escorted to transport via w/c.
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
## 2017-06-02 NOTE — Progress Notes (Signed)
  Echocardiogram 2D Echocardiogram has been performed.  Jennifer Thomas T [name] 06/02/2017, 10:26 AM

## 2017-06-06 LAB — CULTURE, BLOOD (ROUTINE X 2): CULTURE: NO GROWTH
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
## 2017-06-07 LAB — AEROBIC/ANAEROBIC CULTURE W GRAM STAIN (SURGICAL/DEEP WOUND)

## 2017-06-07 LAB — CULTURE, BLOOD (ROUTINE X 2): Culture: NO GROWTH

## 2017-06-07 LAB — AEROBIC/ANAEROBIC CULTURE (SURGICAL/DEEP WOUND)

## 2017-06-19 DIAGNOSIS — M868X7 Other osteomyelitis, ankle and foot: Secondary | ICD-10-CM

## 2017-06-19 DIAGNOSIS — M79671 Pain in right foot: Secondary | ICD-10-CM | POA: Diagnosis not present

## 2017-06-19 DIAGNOSIS — L03115 Cellulitis of right lower limb: Secondary | ICD-10-CM

## 2017-06-19 DIAGNOSIS — N179 Acute kidney failure, unspecified: Secondary | ICD-10-CM

## 2017-06-19 DIAGNOSIS — J9811 Atelectasis: Secondary | ICD-10-CM | POA: Diagnosis not present

## 2017-06-20 DIAGNOSIS — N179 Acute kidney failure, unspecified: Secondary | ICD-10-CM | POA: Diagnosis not present

## 2017-06-20 DIAGNOSIS — M86671 Other chronic osteomyelitis, right ankle and foot: Secondary | ICD-10-CM | POA: Diagnosis not present

## 2017-06-20 DIAGNOSIS — M79671 Pain in right foot: Secondary | ICD-10-CM | POA: Diagnosis not present

## 2017-06-20 DIAGNOSIS — L03115 Cellulitis of right lower limb: Secondary | ICD-10-CM | POA: Diagnosis not present

## 2017-06-20 DIAGNOSIS — M868X7 Other osteomyelitis, ankle and foot: Secondary | ICD-10-CM | POA: Diagnosis not present

## 2017-06-20 DIAGNOSIS — M79604 Pain in right leg: Secondary | ICD-10-CM | POA: Diagnosis not present

## 2017-06-21 DIAGNOSIS — L03115 Cellulitis of right lower limb: Secondary | ICD-10-CM | POA: Diagnosis not present

## 2017-06-21 DIAGNOSIS — M868X7 Other osteomyelitis, ankle and foot: Secondary | ICD-10-CM | POA: Diagnosis not present

## 2017-06-21 DIAGNOSIS — N179 Acute kidney failure, unspecified: Secondary | ICD-10-CM | POA: Diagnosis not present

## 2017-06-21 DIAGNOSIS — J9811 Atelectasis: Secondary | ICD-10-CM | POA: Diagnosis not present

## 2017-06-21 DIAGNOSIS — M79671 Pain in right foot: Secondary | ICD-10-CM | POA: Diagnosis not present

## 2017-06-22 DIAGNOSIS — L03115 Cellulitis of right lower limb: Secondary | ICD-10-CM | POA: Diagnosis not present

## 2017-06-22 DIAGNOSIS — M79671 Pain in right foot: Secondary | ICD-10-CM | POA: Diagnosis not present

## 2017-06-22 DIAGNOSIS — M868X7 Other osteomyelitis, ankle and foot: Secondary | ICD-10-CM | POA: Diagnosis not present

## 2017-06-22 DIAGNOSIS — N179 Acute kidney failure, unspecified: Secondary | ICD-10-CM | POA: Diagnosis not present

## 2017-07-08 DIAGNOSIS — N179 Acute kidney failure, unspecified: Secondary | ICD-10-CM | POA: Diagnosis not present

## 2017-07-08 DIAGNOSIS — Z89511 Acquired absence of right leg below knee: Secondary | ICD-10-CM

## 2017-07-08 DIAGNOSIS — M86171 Other acute osteomyelitis, right ankle and foot: Secondary | ICD-10-CM | POA: Diagnosis not present

## 2017-07-08 DIAGNOSIS — F1910 Other psychoactive substance abuse, uncomplicated: Secondary | ICD-10-CM

## 2017-07-09 DIAGNOSIS — I517 Cardiomegaly: Secondary | ICD-10-CM

## 2017-07-09 DIAGNOSIS — N179 Acute kidney failure, unspecified: Secondary | ICD-10-CM | POA: Diagnosis not present

## 2017-07-09 DIAGNOSIS — F1910 Other psychoactive substance abuse, uncomplicated: Secondary | ICD-10-CM | POA: Diagnosis not present

## 2017-07-09 DIAGNOSIS — Z89511 Acquired absence of right leg below knee: Secondary | ICD-10-CM | POA: Diagnosis not present

## 2017-07-09 DIAGNOSIS — M79604 Pain in right leg: Secondary | ICD-10-CM | POA: Diagnosis not present

## 2017-07-09 DIAGNOSIS — M86171 Other acute osteomyelitis, right ankle and foot: Secondary | ICD-10-CM | POA: Diagnosis not present

## 2017-07-10 DIAGNOSIS — F1910 Other psychoactive substance abuse, uncomplicated: Secondary | ICD-10-CM | POA: Diagnosis not present

## 2017-07-10 DIAGNOSIS — Z89511 Acquired absence of right leg below knee: Secondary | ICD-10-CM | POA: Diagnosis not present

## 2017-07-10 DIAGNOSIS — M86171 Other acute osteomyelitis, right ankle and foot: Secondary | ICD-10-CM | POA: Diagnosis not present

## 2017-07-10 DIAGNOSIS — N179 Acute kidney failure, unspecified: Secondary | ICD-10-CM | POA: Diagnosis not present

## 2017-07-11 DIAGNOSIS — F1910 Other psychoactive substance abuse, uncomplicated: Secondary | ICD-10-CM | POA: Diagnosis not present

## 2017-07-11 DIAGNOSIS — M86171 Other acute osteomyelitis, right ankle and foot: Secondary | ICD-10-CM | POA: Diagnosis not present

## 2017-07-11 DIAGNOSIS — Z89511 Acquired absence of right leg below knee: Secondary | ICD-10-CM | POA: Diagnosis not present

## 2017-07-11 DIAGNOSIS — N179 Acute kidney failure, unspecified: Secondary | ICD-10-CM | POA: Diagnosis not present

## 2017-07-12 DIAGNOSIS — Z89511 Acquired absence of right leg below knee: Secondary | ICD-10-CM | POA: Diagnosis not present

## 2017-07-12 DIAGNOSIS — F1910 Other psychoactive substance abuse, uncomplicated: Secondary | ICD-10-CM | POA: Diagnosis not present

## 2017-07-12 DIAGNOSIS — N179 Acute kidney failure, unspecified: Secondary | ICD-10-CM | POA: Diagnosis not present

## 2017-07-12 DIAGNOSIS — M86171 Other acute osteomyelitis, right ankle and foot: Secondary | ICD-10-CM | POA: Diagnosis not present

## 2017-07-13 DIAGNOSIS — M86171 Other acute osteomyelitis, right ankle and foot: Secondary | ICD-10-CM | POA: Diagnosis not present

## 2017-07-13 DIAGNOSIS — Z89511 Acquired absence of right leg below knee: Secondary | ICD-10-CM | POA: Diagnosis not present

## 2017-07-13 DIAGNOSIS — F1910 Other psychoactive substance abuse, uncomplicated: Secondary | ICD-10-CM | POA: Diagnosis not present

## 2017-07-13 DIAGNOSIS — N179 Acute kidney failure, unspecified: Secondary | ICD-10-CM | POA: Diagnosis not present

## 2017-10-05 DIAGNOSIS — Z09 Encounter for follow-up examination after completed treatment for conditions other than malignant neoplasm: Secondary | ICD-10-CM | POA: Insufficient documentation

## 2017-10-05 HISTORY — DX: Encounter for follow-up examination after completed treatment for conditions other than malignant neoplasm: Z09

## 2018-02-07 HISTORY — PX: GALLBLADDER SURGERY: SHX652

## 2019-12-28 IMAGING — CR DG FOOT COMPLETE 3+V*R*
3 series · 3 of 3 positions shown · non-contrast
Comparison: CT of the LOWER extremity on 02/17/2016

CLINICAL DATA: Per order- possible infection Per patient, states
she has took holes in her right ankle which is draining puss-states
she was supposed to get foot amputated but went to jail
instead-states it has been going on for months

EXAM:
RIGHT FOOT COMPLETE - 3+ VIEW

[x foot ap right]
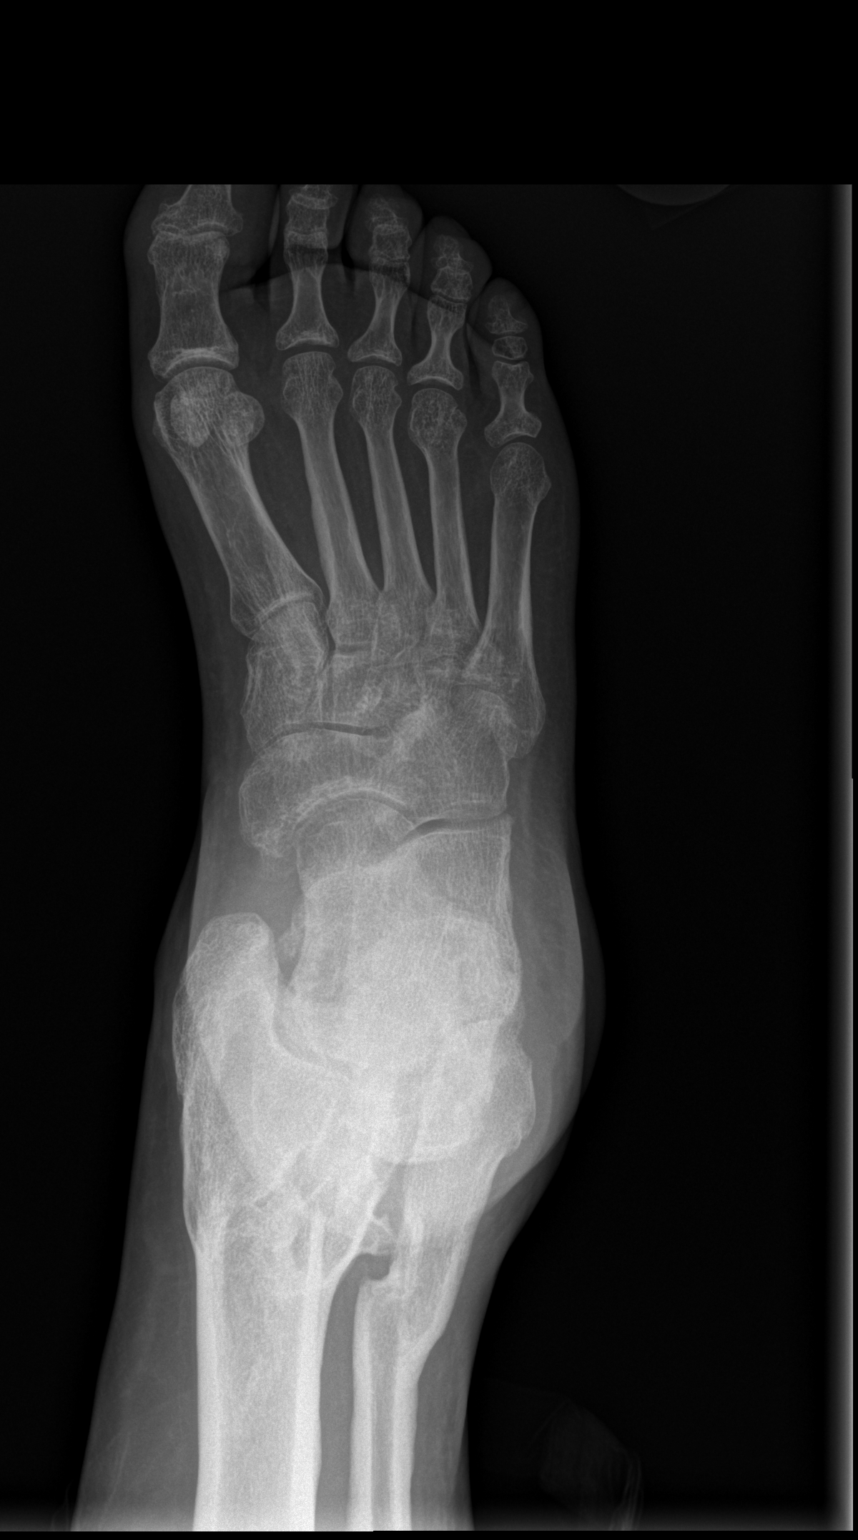

[x foot obl right]
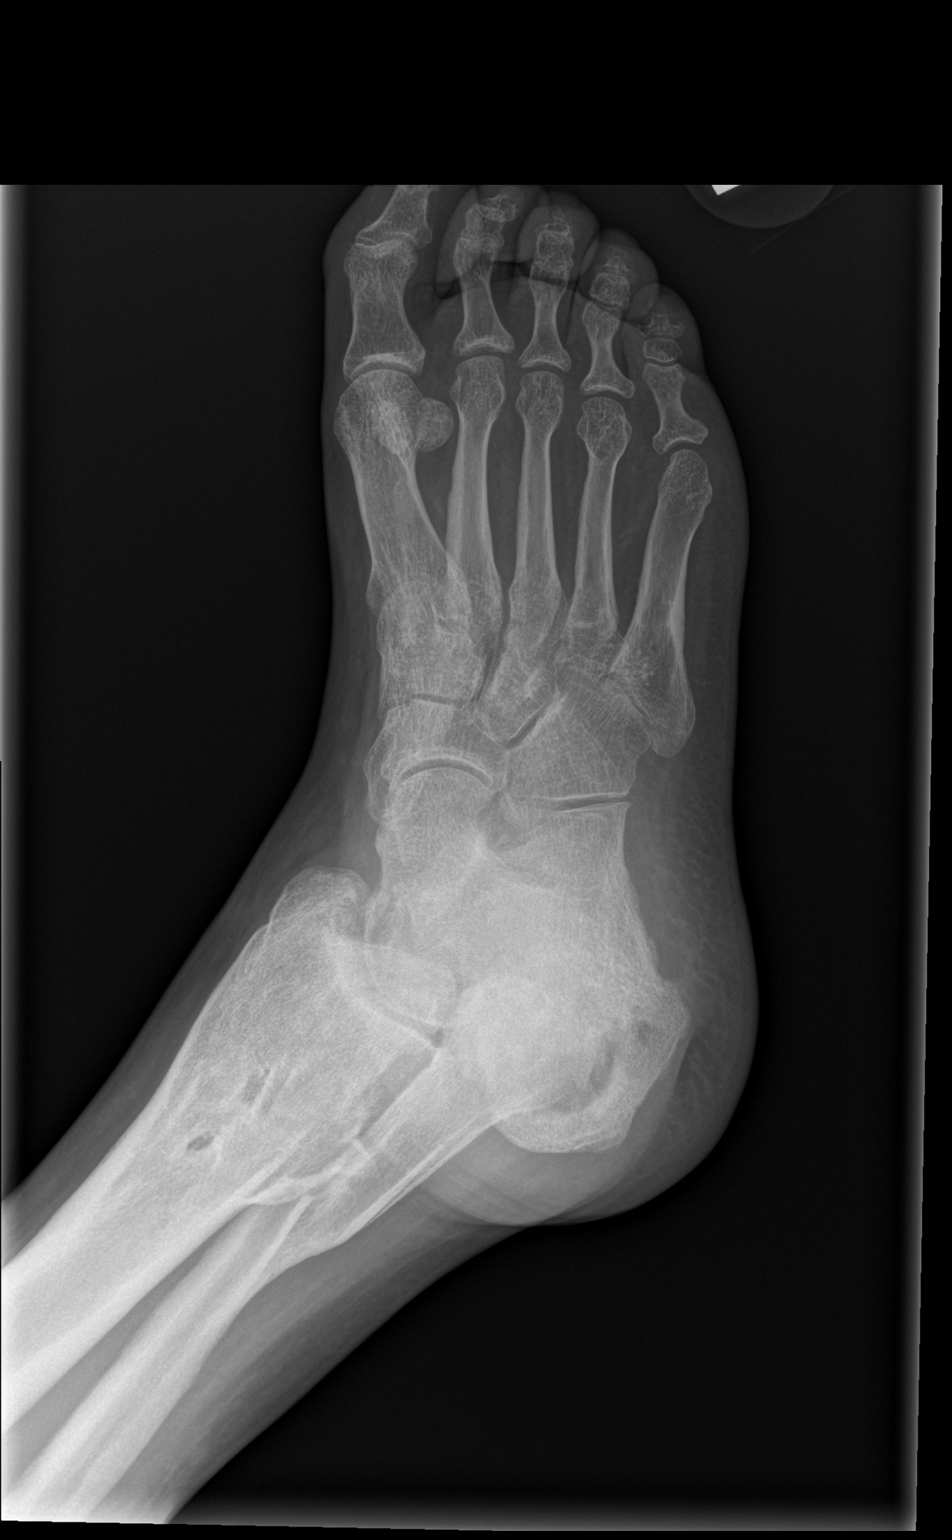

[x foot lat right]
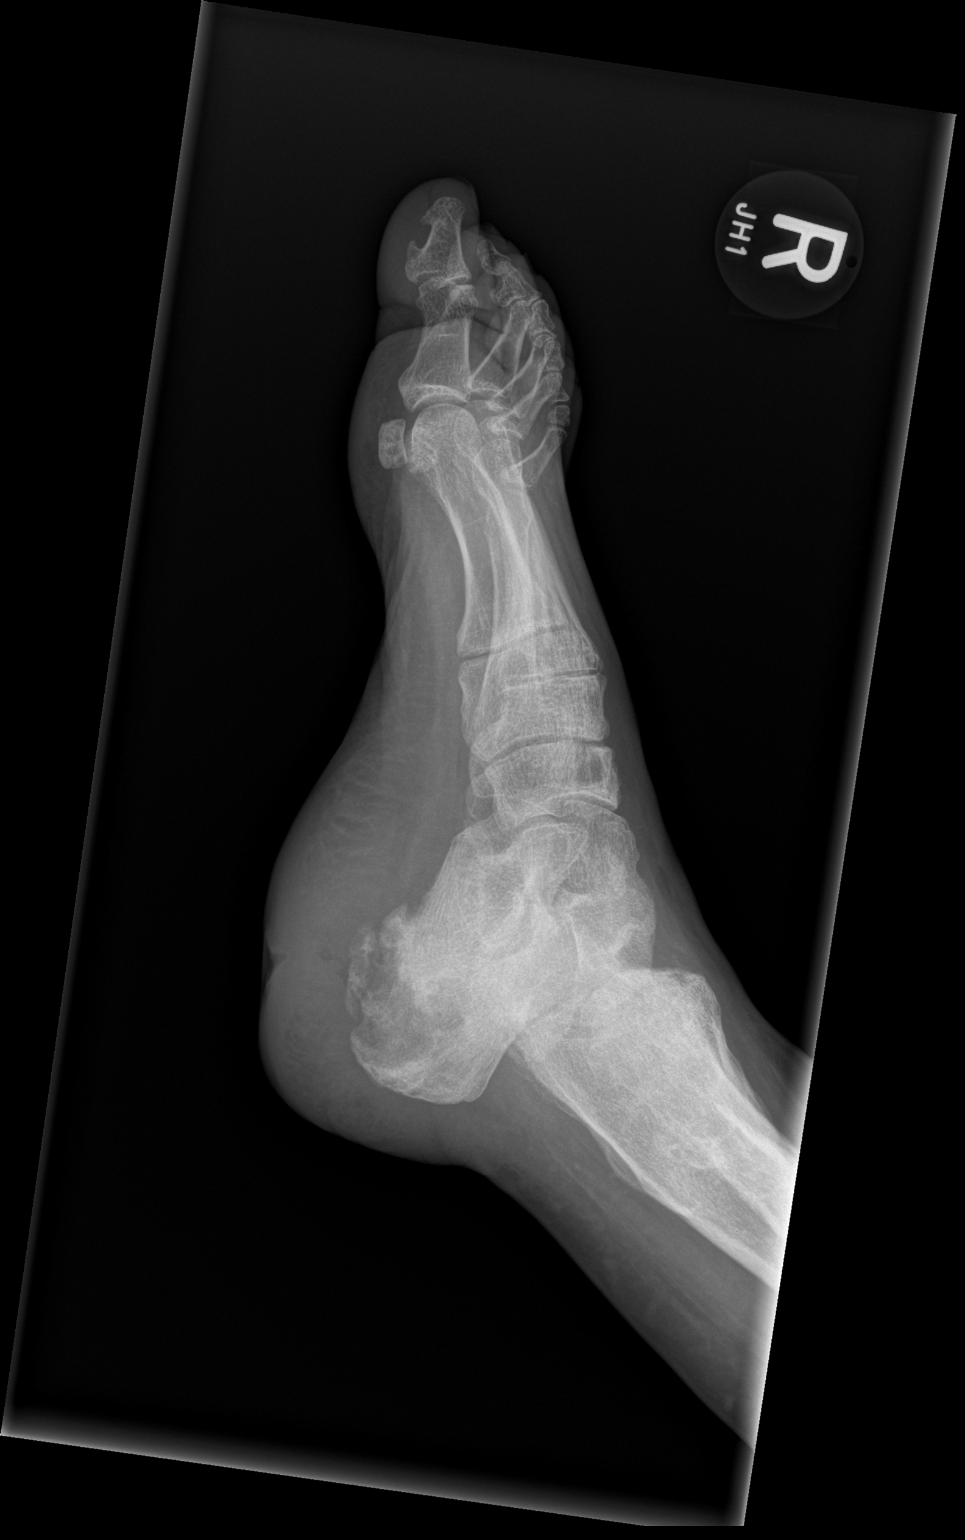

[3 of 3 positions shown; findings below may reference images not displayed]

FINDINGS: There is deformity of the distal tibia and fibula, consistent with
prior fracture. There is diffuse soft tissue swelling of the foot
and ankle. Sclerotic and lytic lesion in the calcaneus is consistent
with osteomyelitis and has progressed since the previous exam. There
is a soft tissue defect and a small area of soft tissue gas along
the plantar surface of the heel.
IMPRESSION: Progressive changes of osteomyelitis.

Suspect draining fistula to the plantar surface of the heel,
consistent with Brodie abscess.

## 2021-09-14 DIAGNOSIS — N319 Neuromuscular dysfunction of bladder, unspecified: Secondary | ICD-10-CM

## 2021-09-14 DIAGNOSIS — F1120 Opioid dependence, uncomplicated: Secondary | ICD-10-CM

## 2021-09-14 DIAGNOSIS — F1990 Other psychoactive substance use, unspecified, uncomplicated: Secondary | ICD-10-CM

## 2021-09-14 DIAGNOSIS — L0390 Cellulitis, unspecified: Secondary | ICD-10-CM

## 2021-09-14 DIAGNOSIS — G8190 Hemiplegia, unspecified affecting unspecified side: Secondary | ICD-10-CM

## 2021-09-14 DIAGNOSIS — S88111A Complete traumatic amputation at level between knee and ankle, right lower leg, initial encounter: Secondary | ICD-10-CM

## 2021-09-14 HISTORY — DX: Other psychoactive substance use, unspecified, uncomplicated: F19.90

## 2021-09-14 HISTORY — DX: Cellulitis, unspecified: L03.90

## 2021-09-14 HISTORY — DX: Opioid dependence, uncomplicated: F11.20

## 2021-09-14 HISTORY — DX: Hemiplegia, unspecified affecting unspecified side: G81.90

## 2021-09-14 HISTORY — DX: Neuromuscular dysfunction of bladder, unspecified: N31.9

## 2021-09-14 HISTORY — DX: Complete traumatic amputation at level between knee and ankle, right lower leg, initial encounter: S88.111A

## 2022-04-09 ENCOUNTER — Observation Stay (HOSPITAL_COMMUNITY)
Admit: 2022-04-09 | Discharge: 2022-04-09 | DRG: 313 | Discharge status: Against Medical Advice | Payer: Medicaid Other | Source: Other Acute Inpatient Hospital | Attending: Internal Medicine | Admitting: Internal Medicine

## 2022-04-09 ENCOUNTER — Encounter (HOSPITAL_COMMUNITY): Payer: Self-pay

## 2022-04-09 DIAGNOSIS — I4719 Other supraventricular tachycardia: Secondary | ICD-10-CM | POA: Diagnosis present

## 2022-04-09 DIAGNOSIS — I493 Ventricular premature depolarization: Secondary | ICD-10-CM | POA: Diagnosis present

## 2022-04-09 DIAGNOSIS — R079 Chest pain, unspecified: Secondary | ICD-10-CM

## 2022-04-09 DIAGNOSIS — N390 Urinary tract infection, site not specified: Secondary | ICD-10-CM | POA: Diagnosis present

## 2022-04-09 DIAGNOSIS — R0789 Other chest pain: Secondary | ICD-10-CM | POA: Diagnosis present

## 2022-04-09 DIAGNOSIS — Z5329 Procedure and treatment not carried out because of patient's decision for other reasons: Secondary | ICD-10-CM | POA: Diagnosis present

## 2022-04-09 DIAGNOSIS — I10 Essential (primary) hypertension: Secondary | ICD-10-CM | POA: Diagnosis present

## 2022-04-09 HISTORY — DX: Chest pain, unspecified: R07.9

## 2022-04-09 MED ORDER — SODIUM CHLORIDE 0.9% FLUSH: 3.0000 mL | Freq: Two times a day (BID) | INTRAVENOUS | Status: DC

## 2022-04-09 MED ORDER — ALBUTEROL SULFATE (2.5 MG/3ML) 0.083% IN NEBU: 2.5000 mg | INHALATION_SOLUTION | Freq: Four times a day (QID) | RESPIRATORY_TRACT | Status: DC | PRN

## 2022-04-09 MED ORDER — ENOXAPARIN SODIUM 40 MG/0.4ML IJ SOSY: 40.0000 mg | PREFILLED_SYRINGE | INTRAMUSCULAR | Status: DC

## 2022-04-09 MED ORDER — AMIODARONE HCL IN DEXTROSE 360-4.14 MG/200ML-% IV SOLN: 60.0000 mg/h | INTRAVENOUS | Status: DC

## 2022-04-09 MED ORDER — ACETAMINOPHEN 650 MG RE SUPP: 650.0000 mg | Freq: Four times a day (QID) | RECTAL | Status: DC | PRN

## 2022-04-09 MED ORDER — AMIODARONE HCL IN DEXTROSE 360-4.14 MG/200ML-% IV SOLN: 30.0000 mg/h | INTRAVENOUS | Status: DC

## 2022-04-09 MED ORDER — ACETAMINOPHEN 325 MG PO TABS: 650.0000 mg | ORAL_TABLET | Freq: Four times a day (QID) | ORAL | Status: DC | PRN

## 2022-04-09 NOTE — H&P (Signed)
  History and Physical    Patient: Jennifer Thomas [id number] DOB: 1980-10-10 DOA: 04/09/2022 DOS: the patient was seen and examined on 04/09/2022 PCP: Patient, No Pcp Per  Patient coming from: Transfer from [facility]  Chief Complaint: Chest pain HPI: Lache Saman is a 42 y.o. female with history of IV drug use, hypertension, WPW syndrome, SVT presented to ED with chest pain and tachycardia.  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  She was given adenosine 6 mg followed by 12 mg but did not convert and had to be cardioverted.  Now in sinus rhythm with frequent PVCs.  Blood pressure 118/63.  She is on amiodarone drip and was given IV magnesium 2 g. Also has a UTI, WBC count 13.5, afebrile.  She was given Rocephin.  ED physician spoke to Dr. Shirlee Latch on-call for cardiology here at [facility] who requested hospitalist admission and cardiology team will consult/get EP involved.  However,after getting transferred to [facility] stated that she wanted to leave [place].  Nursing had talked to the patient in regards to her risk of leaving including death.  Patient stated that she understood the risks and still wanted to leave [place].  The central line that was in place was removed and patient signed out Amelia prior to being formally seen and evaluated.

## 2022-04-09 NOTE — Progress Notes (Signed)
Patient got up approximately 5 minutes prior to being off bed rest d/t removal of femoral central line. Femoral site was found to be bleeding. Bedside RN held pressure and applied a pressure dressing. Patient encouraged to restart bedrest. Patient insistent that she leaves immediately. Educated again on risks of leaving including risks of bleeding. Patient verbalized she understands. Patient left AMA, no signs of active bleeding upon leaving.

## 2022-04-09 NOTE — Progress Notes (Signed)
Patient wanting to leave AMA. Provider aware and bedside. Patient understands the risks of leaving and still insisting she leave AMA. Central line in groin is being removed. Patient agreeable to lay flat for 30 minutes post removal. Will leave after.

## 2022-04-09 NOTE — Discharge Summary (Addendum)
  Physician Discharge Summary   Patient: Jennifer Thomas MRN: [id number] DOB: [birth year]  Admit date:     04/09/2022  Discharge date: 04/09/22  Discharge Physician: Norval Morton   PCP: Patient, No Pcp Per   Patient left AGAINST MEDICAL ADVICE prior to being formally seen by myself after being transferred from [facility] for need of radiology and EP consultative services.   Signed: Norval Morton, MD [facility] 04/09/2022

## 2022-10-04 ENCOUNTER — Other Ambulatory Visit: Payer: Self-pay

## 2022-10-04 DIAGNOSIS — J45909 Unspecified asthma, uncomplicated: Secondary | ICD-10-CM | POA: Insufficient documentation

## 2022-10-04 DIAGNOSIS — M869 Osteomyelitis, unspecified: Secondary | ICD-10-CM | POA: Insufficient documentation

## 2022-10-04 DIAGNOSIS — N390 Urinary tract infection, site not specified: Secondary | ICD-10-CM | POA: Insufficient documentation

## 2022-10-04 DIAGNOSIS — Z6835 Body mass index (BMI) 35.0-35.9, adult: Secondary | ICD-10-CM | POA: Insufficient documentation

## 2022-10-04 DIAGNOSIS — A480 Gas gangrene: Secondary | ICD-10-CM

## 2022-10-04 DIAGNOSIS — R5383 Other fatigue: Secondary | ICD-10-CM | POA: Insufficient documentation

## 2022-10-04 DIAGNOSIS — G8220 Paraplegia, unspecified: Secondary | ICD-10-CM

## 2022-10-04 DIAGNOSIS — I10 Essential (primary) hypertension: Secondary | ICD-10-CM | POA: Insufficient documentation

## 2022-10-04 DIAGNOSIS — A419 Sepsis, unspecified organism: Secondary | ICD-10-CM

## 2022-10-04 DIAGNOSIS — R918 Other nonspecific abnormal finding of lung field: Secondary | ICD-10-CM | POA: Insufficient documentation

## 2022-10-04 DIAGNOSIS — I456 Pre-excitation syndrome: Secondary | ICD-10-CM | POA: Insufficient documentation

## 2022-10-04 DIAGNOSIS — M62838 Other muscle spasm: Secondary | ICD-10-CM | POA: Insufficient documentation

## 2022-10-04 DIAGNOSIS — T8859XA Other complications of anesthesia, initial encounter: Secondary | ICD-10-CM | POA: Insufficient documentation

## 2022-10-04 DIAGNOSIS — R000 Tachycardia, unspecified: Secondary | ICD-10-CM

## 2022-10-04 HISTORY — DX: Osteomyelitis, unspecified: M86.9

## 2022-10-04 HISTORY — DX: Urinary tract infection, site not specified: N39.0

## 2022-10-04 HISTORY — DX: Other nonspecific abnormal finding of lung field: R91.8

## 2022-10-04 HISTORY — DX: Paraplegia, unspecified: G82.20

## 2022-10-04 HISTORY — DX: Gas gangrene: A48.0

## 2022-10-04 HISTORY — DX: Tachycardia, unspecified: R00.0

## 2022-10-04 HISTORY — DX: Sepsis, unspecified organism: A41.9

## 2022-10-04 HISTORY — DX: Other muscle spasm: M62.838

## 2022-10-12 ENCOUNTER — Ambulatory Visit: Payer: MEDICAID

## 2022-10-19 ENCOUNTER — Ambulatory Visit: Payer: MEDICAID

## 2022-12-15 ENCOUNTER — Ambulatory Visit: Payer: MEDICAID | Admitting: Orthopedic Surgery

## 2022-12-26 ENCOUNTER — Ambulatory Visit: Payer: MEDICAID | Admitting: Orthopedic Surgery

## 2023-01-11 ENCOUNTER — Ambulatory Visit: Payer: MEDICAID | Attending: Cardiology | Admitting: Cardiology

## 2023-01-23 ENCOUNTER — Ambulatory Visit: Payer: MEDICAID | Admitting: Orthopedic Surgery
